# Patient Record
Sex: Male | Born: 1975 | Race: Black or African American | Hispanic: No | Marital: Single
Health system: Southern US, Community
[De-identification: ages and names within clinical notes are randomized; demographics above are authoritative.]

---

## 2015-10-09 ENCOUNTER — Emergency Department
Admission: EM | Admit: 2015-10-09 | Discharge: 2015-10-10 | Disposition: A | Discharge status: Home / Self Care | Payer: BLUE CROSS/BLUE SHIELD | Attending: Student | Admitting: Student

## 2015-10-09 ENCOUNTER — Emergency Department: Payer: BLUE CROSS/BLUE SHIELD

## 2015-10-09 ENCOUNTER — Encounter: Payer: Self-pay | Admitting: Emergency Medicine

## 2015-10-09 DIAGNOSIS — R112 Nausea with vomiting, unspecified: Secondary | ICD-10-CM | POA: Diagnosis not present

## 2015-10-09 DIAGNOSIS — R55 Syncope and collapse: Secondary | ICD-10-CM | POA: Diagnosis not present

## 2015-10-09 DIAGNOSIS — J069 Acute upper respiratory infection, unspecified: Secondary | ICD-10-CM | POA: Diagnosis not present

## 2015-10-09 DIAGNOSIS — R197 Diarrhea, unspecified: Secondary | ICD-10-CM | POA: Diagnosis not present

## 2015-10-09 DIAGNOSIS — B349 Viral infection, unspecified: Secondary | ICD-10-CM | POA: Diagnosis not present

## 2015-10-09 LAB — COMPREHENSIVE METABOLIC PANEL
ALT: 29 U/L (ref 17–63)
AST: 43 U/L — AB (ref 15–41)
Albumin: 4.2 g/dL (ref 3.5–5.0)
Alkaline Phosphatase: 76 U/L (ref 38–126)
Anion gap: 7 (ref 5–15)
BILIRUBIN TOTAL: 0.8 mg/dL (ref 0.3–1.2)
BUN: 11 mg/dL (ref 6–20)
CHLORIDE: 104 mmol/L (ref 101–111)
CO2: 21 mmol/L — ABNORMAL LOW (ref 22–32)
Calcium: 9.1 mg/dL (ref 8.9–10.3)
Creatinine, Ser: 1.41 mg/dL — ABNORMAL HIGH (ref 0.61–1.24)
Glucose, Bld: 108 mg/dL — ABNORMAL HIGH (ref 65–99)
POTASSIUM: 4.2 mmol/L (ref 3.5–5.1)
Sodium: 132 mmol/L — ABNORMAL LOW (ref 135–145)
TOTAL PROTEIN: 7.7 g/dL (ref 6.5–8.1)

## 2015-10-09 LAB — TROPONIN I

## 2015-10-09 LAB — RAPID INFLUENZA A&B ANTIGENS (ARMC ONLY)
INFLUENZA A (ARMC): NEGATIVE
INFLUENZA B (ARMC): NEGATIVE

## 2015-10-09 LAB — CBC WITH DIFFERENTIAL/PLATELET
BASOS ABS: 0 10*3/uL (ref 0–0.1)
Basophils Relative: 0 %
EOS PCT: 1 %
Eosinophils Absolute: 0.1 10*3/uL (ref 0–0.7)
HEMATOCRIT: 41.8 % (ref 40.0–52.0)
Hemoglobin: 13.9 g/dL (ref 13.0–18.0)
LYMPHS ABS: 0.4 10*3/uL — AB (ref 1.0–3.6)
LYMPHS PCT: 4 %
MCH: 28.3 pg (ref 26.0–34.0)
MCHC: 33.4 g/dL (ref 32.0–36.0)
MCV: 84.8 fL (ref 80.0–100.0)
Monocytes Absolute: 0.8 10*3/uL (ref 0.2–1.0)
Monocytes Relative: 7 %
NEUTROS PCT: 88 %
Neutro Abs: 10.3 10*3/uL — ABNORMAL HIGH (ref 1.4–6.5)
PLATELETS: 220 10*3/uL (ref 150–440)
RBC: 4.92 MIL/uL (ref 4.40–5.90)
RDW: 12.5 % (ref 11.5–14.5)
WBC: 11.6 10*3/uL — AB (ref 3.8–10.6)

## 2015-10-09 LAB — LIPASE, BLOOD: Lipase: 29 U/L (ref 11–51)

## 2015-10-09 MED ORDER — SODIUM CHLORIDE 0.9 % IV BOLUS (SEPSIS)
1000.0000 mL | Freq: Once | INTRAVENOUS | Status: AC
  Administered 2015-10-09: 1000 mL via INTRAVENOUS

## 2015-10-09 MED ORDER — ONDANSETRON 4 MG PO TBDP: 4.0000 mg | ORAL_TABLET | Freq: Three times a day (TID) | ORAL | Status: AC | PRN

## 2015-10-09 MED ORDER — ACETAMINOPHEN 325 MG PO TABS
ORAL_TABLET | ORAL | Status: AC
  Administered 2015-10-09: 975 mg via ORAL
  Filled 2015-10-09: qty 3

## 2015-10-09 MED ORDER — ONDANSETRON HCL 4 MG/2ML IJ SOLN
4.0000 mg | Freq: Once | INTRAMUSCULAR | Status: AC
  Administered 2015-10-09: 4 mg via INTRAVENOUS
  Filled 2015-10-09: qty 2

## 2015-10-09 MED ORDER — ACETAMINOPHEN 500 MG PO TABS
1000.0000 mg | ORAL_TABLET | Freq: Once | ORAL | Status: AC
  Administered 2015-10-09: 975 mg via ORAL

## 2015-10-09 NOTE — ED Notes (Signed)
EMS reports pt had a syncopal episode witnessed by his mother. Pt reports being sick for a few days.

## 2015-10-09 NOTE — ED Provider Notes (Addendum)
[facility] Emergency Department Provider Note  ____________________________________________  Time seen: Approximately 9:03 PM  I have reviewed the triage vital signs and the nursing notes.   HISTORY  Chief Complaint URI and Loss of Consciousness    HPI Louis Conner is a 40 y.o. male with no chronic medical problems who presents for evaluation of his multiple episodes of nonbloody nonbilious emesis as well as nonbloody diarrhea this evening as well as subjective fever and chills, sudden onset, constant since onset, severe, no modifying factors. The patient reports that he awoke today in his usual state of health. He ate eggs from a restaurant earlier in the afternoon after which he developed many episodes of nonbloody nonbilious emesis. He is unable to quantify how many episodes. He also had 3-4 episodes of diarrhea. He also reports that while he was vomiting over the toilet bowl he thinks he may have lost consciousness. He awoke on the bathroom floor with his dog licking his face. He then called his mother who arrived to his his home and called 911. He denies any chest pain, palpitations, shortness of breath, abdominal pain or dysuria. No known sick contacts. No personal or family history of early coronary artery disease. No family history of sudden cardiac death.   History reviewed. No pertinent past medical history.  There are no active problems to display for this patient.   No past surgical history on file.  No current outpatient prescriptions on file.  Allergies Review of patient's allergies indicates no known allergies.  History reviewed. No pertinent family history.  Social History Social History  Substance Use Topics  . Smoking status: None  . Smokeless tobacco: None  . Alcohol Use: None    Review of Systems Constitutional: + subjective fever/chills Eyes: No visual changes. ENT: No sore throat. Cardiovascular: Denies chest  pain. Respiratory: Denies shortness of breath. Gastrointestinal: No abdominal pain.  + nausea, + vomiting.  + diarrhea.  No constipation. Genitourinary: Negative for dysuria. Musculoskeletal: Negative for back pain. Skin: Negative for rash. Neurological: Negative for headaches, focal weakness or numbness.  10-point ROS otherwise negative.  ____________________________________________   PHYSICAL EXAM:  Filed Vitals:   10/09/15 2108 10/09/15 2109 10/09/15 2334  BP: 145/103  116/61  Pulse: 93  83  Temp: 99.6 F (37.6 C)  99.4 F (37.4 C)  TempSrc:   Oral  Resp: 30  18  SpO2: 98% 97% 96%    Constitutional: Alert and oriented. + shaking chills Eyes: Conjunctivae are normal. PERRL. EOMI. Head: Atraumatic. Nose: No congestion/rhinnorhea. Mouth/Throat: Mucous membranes are dry.  Oropharynx non-erythematous. Neck: No stridor. No cervical spine tenderness to palpation. Cardiovascular: Normal rate, regular rhythm. Grossly normal heart sounds.  Good peripheral circulation. Respiratory: Normal respiratory effort.  No retractions. Lungs CTAB. Gastrointestinal: Soft and nontender. No distention.  No CVA tenderness. Genitourinary: deferred Musculoskeletal: No lower extremity tenderness nor edema.  No joint effusions. Neurologic:  Normal speech and language. No gross focal neurologic deficits are appreciated.  Skin:  Skin is warm, dry and intact. No rash noted. Psychiatric: Mood and affect are normal. Speech and behavior are normal.  ____________________________________________   LABS (all labs ordered are listed, but only abnormal results are displayed)  Labs Reviewed  CBC WITH DIFFERENTIAL/PLATELET - Abnormal; Notable for the following:    WBC 11.6 (*)    Neutro Abs 10.3 (*)    Lymphs Abs 0.4 (*)    All other components within normal limits  COMPREHENSIVE METABOLIC PANEL - Abnormal; Notable for  the following:    Sodium 132 (*)    CO2 21 (*)    Glucose, Bld 108 (*)     Creatinine, Ser 1.41 (*)    AST 43 (*)    All other components within normal limits  RAPID INFLUENZA A&B ANTIGENS ([facility] ONLY)  LIPASE, BLOOD  TROPONIN I   ____________________________________________  EKG  ED ECG REPORT I, Gayla DossGayle, Marium Ragan A, the attending physician, personally viewed and interpreted this ECG.   Date: 10/09/2015  EKG Time: 23:38  Rate: 82  Rhythm: normal sinus rhythm  Axis: normal  Intervals:none  ST&T Change: No acute ST elevation. Q waves in lead 3, borderline T-wave abnormalities in the anterolateral leads. No acute ST elevation. Normal QTC.  ____________________________________________  RADIOLOGY  CXR IMPRESSION: No active cardiopulmonary disease.  ____________________________________________   PROCEDURES  Procedure(s) performed: None  Critical Care performed: No  ____________________________________________   INITIAL IMPRESSION / ASSESSMENT AND PLAN / ED COURSE  Pertinent labs & imaging results that were available during my care of the patient were reviewed by me and considered in my medical decision making (see chart for details).  Louis Conner is a 40 y.o. male with no chronic medical problems who presents for evaluation of his multiple episodes of nonbloody nonbilious emesis as well as nonbloody diarrhea this evening as well as subjective fever and chills. On exam, he is awake, alert and oriented but appears nauseated with shaking chills. He is also tachypneic, mildly hypertensive. His abdominal exam is benign and in general his exam is atraumatic. He has an intact neurological examination. Suspect viral syndrome versus foodborne illness as the most likely cause of his repetitive vomiting and diarrhea. Additionally, suspect that he might have had an episode of vasovagal syncope while vomiting, repetitive vomiting certainly could have stimulated his vagal nerve. No chest pain or shortness of breath, doubt purely cardiogenic cause of syncope in this  young healthy patient with no significant family history. Doubt. Neurogenic cause syncope given his intact neuro exam. We'll obtain screening labs, treat symptomatically and reassess for disposition.   ----------------------------------------- 11:42 PM on 10/09/2015 -----------------------------------------  Patient reports he feels much better at this time, he is sitting up in bed, no longer with shaking chills. He is tolerating by mouth intake. Labs reviewed. CBC with mild leukocytosis. CMP with mild creatinine elevation of 1.41, IV fluids given. Mild AST elevation. Flu negative, normal lipase, negative troponin. Chest x-ray with no active cardiopulmonary  disease. I repeated his EKG because the initial EKG read said "left anterior fascicular block" however there was too much motion artifact too accurately interpret the exam. His repeat EKG is reassuring. It is not consistent with acute ischemia, normal intervals including normal QTC. Discussed symptomatic treatment, return precautions, need for close follow-up with a primary care doctor and he and his family at bedside are comfortable with the discharge plan. DC home. His vital signs have normalized at the time of discharge. ____________________________________________   FINAL CLINICAL IMPRESSION(S) / ED DIAGNOSES  Final diagnoses:  Viral syndrome  Nausea, vomiting and diarrhea  Syncope, unspecified syncope type      Gayla DossEryka A Caley Volkert, MD 10/09/15 2344  Gayla DossEryka A Kirstie Larsen, MD 10/09/15 [phone]

## 2017-01-09 IMAGING — CR DG CHEST 2V
2 series · 2 of 2 positions shown · non-contrast
Comparison: None.

CLINICAL DATA: Fever, cough, shortness of breath, nausea, and
vomiting starting this morning. Syncope.

EXAM:
CHEST  2 VIEW

[chest pa]
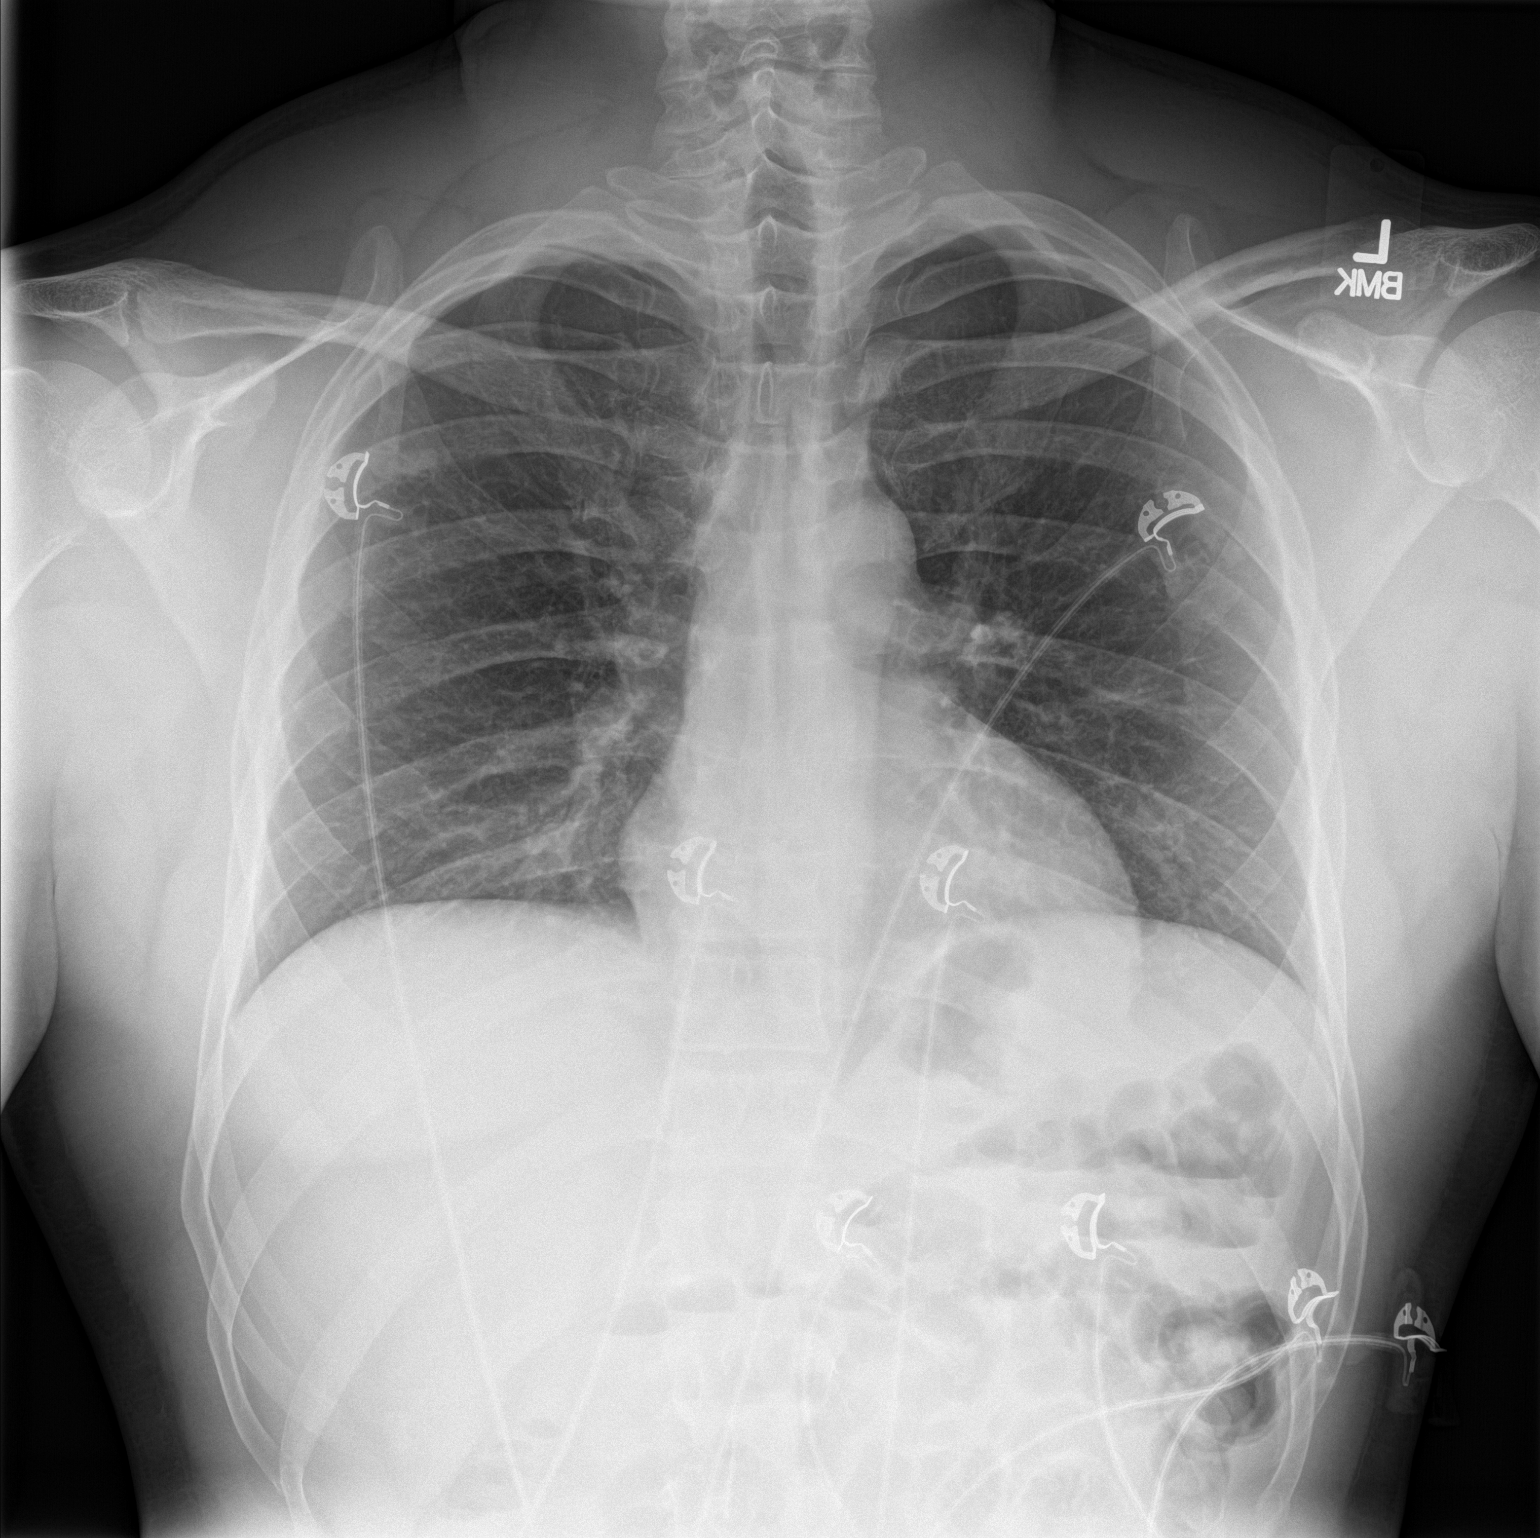

[chest lat]
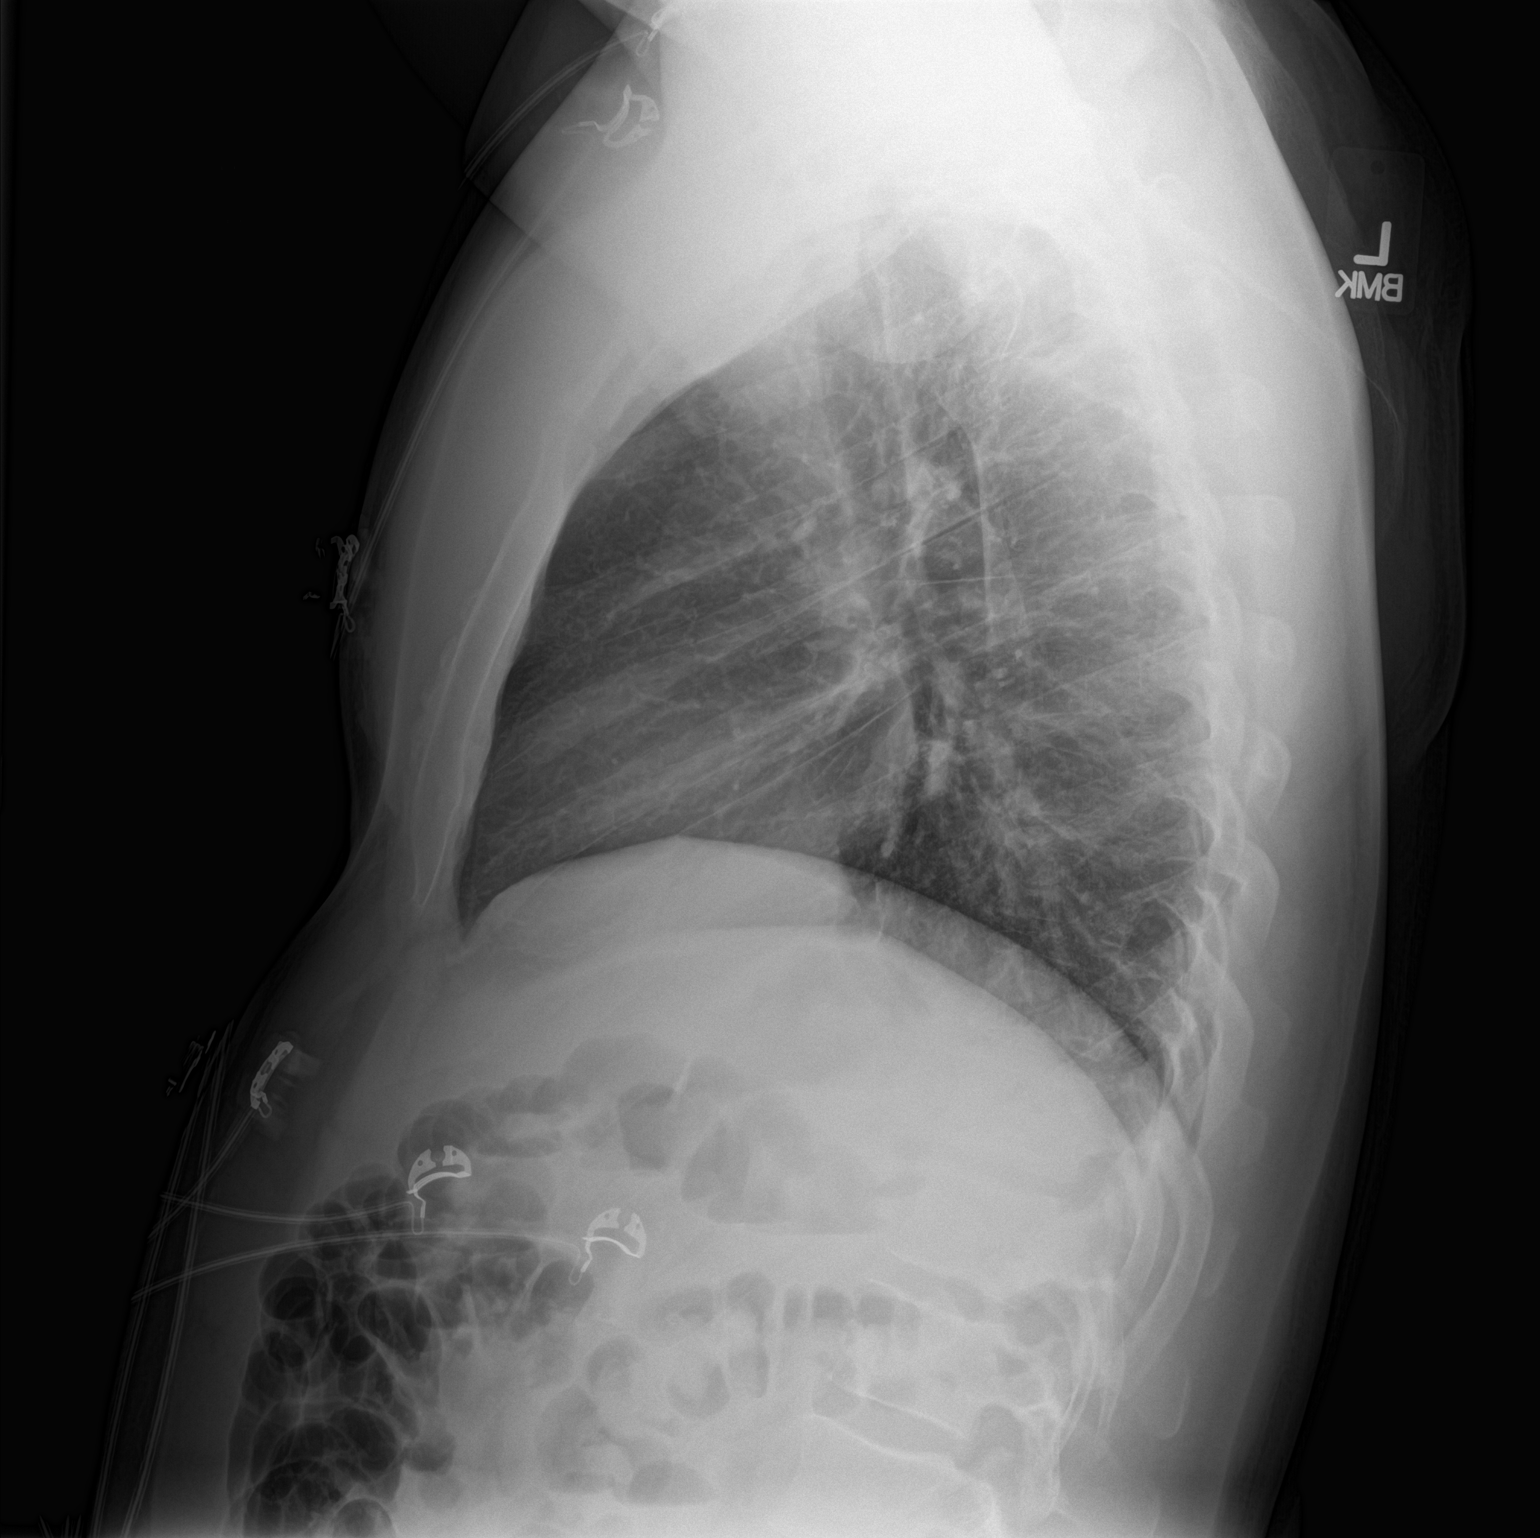

[2 of 2 positions shown; findings below may reference images not displayed]

FINDINGS: Slightly shallow inspiration. Normal heart size and pulmonary
vascularity. No focal airspace disease or consolidation in the
lungs. No blunting of costophrenic angles. No pneumothorax.
Mediastinal contours appear intact.
IMPRESSION: No active cardiopulmonary disease.
